# Patient Record
Sex: Male | Born: 1995 | Race: White | Hispanic: No | Marital: Single
Health system: Southern US, Community
[De-identification: ages and names within clinical notes are randomized; demographics above are authoritative.]

---
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
## 2018-11-13 ENCOUNTER — Other Ambulatory Visit: Payer: Self-pay

## 2018-11-13 DIAGNOSIS — Z20822 Contact with and (suspected) exposure to covid-19: Secondary | ICD-10-CM

## 2018-11-15 LAB — NOVEL CORONAVIRUS, NAA: SARS-CoV-2, NAA: NOT DETECTED

## 2020-02-16 ENCOUNTER — Other Ambulatory Visit: Payer: Self-pay

## 2020-02-16 ENCOUNTER — Emergency Department (HOSPITAL_BASED_OUTPATIENT_CLINIC_OR_DEPARTMENT_OTHER)
Admission: EM | Admit: 2020-02-16 | Discharge: 2020-02-16 | Disposition: A | Discharge status: Home / Self Care | Payer: Self-pay | Attending: Emergency Medicine | Admitting: Emergency Medicine

## 2020-02-16 DIAGNOSIS — R202 Paresthesia of skin: Secondary | ICD-10-CM | POA: Insufficient documentation

## 2020-02-16 DIAGNOSIS — F419 Anxiety disorder, unspecified: Secondary | ICD-10-CM | POA: Insufficient documentation

## 2020-02-16 DIAGNOSIS — R410 Disorientation, unspecified: Secondary | ICD-10-CM | POA: Insufficient documentation

## 2020-02-16 MED ORDER — LORAZEPAM 1 MG PO TABS
1.0000 mg | ORAL_TABLET | Freq: Once | ORAL | Status: AC
  Administered 2020-02-16: 1 mg via ORAL
  Filled 2020-02-16: qty 1

## 2020-02-16 NOTE — ED Notes (Signed)
States he feel's confused and feel's like he's in a fog.  States he has had panic attacks before.

## 2020-02-16 NOTE — ED Triage Notes (Signed)
Pt was at work when he started to feel disoriented, dizzy, midsternal chest pain. Hx of anxiety. Started wellbutrin 1 month ago. Took CBD oil this morning at 0930, which he normally does. States he feels like he is hallucinating and "like we are trying to hurt him"

## 2020-02-16 NOTE — Discharge Instructions (Signed)
You came to the emergency department today to be evaluated for your are "bizarre," disembodied feeling.  Your physical exam was very reassuring.  You received 1 mg of Ativan while in the hospital.  I believe that your symptoms are related to your anxiety.  Please follow-up with the provider who manages your Wellbutrin.  Please refrain from using CBD oil daily you can speak to your provider about this.    Get help right away if you have: A racing heart and shortness of breath. Thoughts of hurting yourself or others.

## 2020-02-16 NOTE — ED Provider Notes (Signed)
[facility] EMERGENCY DEPARTMENT Provider Note   CSN: [id number] Arrival date & time: 02/16/20  1528     History Chief Complaint  Patient presents with  . Chest Pain  . Psychiatric Evaluation    Antonio Parker is a 25 y.o. male with a history of anxiety (currently taking Wellbutrin 150 mg daily and 1 mg Ativan at bedtime.)  Presents with a chief complaint of anxiety and "bizarre, disoriented" feeling.  He reports that this started suddenly while at work approximately 15:00, patient reports that he had numbness and tingling in his hands, around his mouth.  Patient also expressed concern of chest pain with onset of symptoms.  That is symptoms have slowly improved since then.  She states that he has has been higher than usual lately.  Patient reports that he uses CBD oil daily to help with his anxiety.  He states that today he took more CBD oil than usual.  Patient reports that he has had anxiety attacks in the past however this " bizarre, disoriented," sensation is new.    At present patient denies any chest pain, shortness of breath, lightheadedness, dizziness.  Patient denies having any syncopal episodes.  Patient denies any suicidal ideations, homicidal ideations, auditory hallucinations, visual hallucinations.    HPI     No past medical history on file.  There are no problems to display for this patient.       No family history on file.     Home Medications Prior to Admission medications   Not on File    Allergies    Penicillins  Review of Systems   Review of Systems  Constitutional: Negative for chills and fever.  Eyes: Negative for visual disturbance.  Respiratory: Negative for shortness of breath.   Cardiovascular: Negative for chest pain.  Gastrointestinal: Negative for abdominal pain, nausea and vomiting.  Genitourinary: Negative for dysuria.  Musculoskeletal: Negative for back pain and neck pain.  Skin: Negative for color change and rash.   Neurological: Negative for dizziness, syncope, light-headedness and headaches.  Psychiatric/Behavioral: Negative for confusion, hallucinations, self-injury and suicidal ideas. The patient is nervous/anxious.     Physical Exam Updated Vital Signs BP 112/78   Pulse 99   Temp (!) 97.5 F (36.4 C) (Oral)   Resp (!) 21   Ht 5\' 10"  (1.778 m)   Wt 79.4 kg   SpO2 100%   BMI 25.11 kg/m   Physical Exam Vitals and nursing note reviewed.  Constitutional:      General: He is not in acute distress.    Appearance: He is not ill-appearing, toxic-appearing or diaphoretic.  HENT:     Head: Normocephalic and atraumatic. No raccoon eyes, abrasion, contusion, masses or laceration.     Jaw: No trismus or pain on movement.     Mouth/Throat:     Mouth: Mucous membranes are moist.     Pharynx: Uvula midline.  Eyes:     General: No scleral icterus.       Right eye: No discharge.        Left eye: No discharge.     Extraocular Movements: Extraocular movements intact.     Pupils: Pupils are equal, round, and reactive to light.  Cardiovascular:     Rate and Rhythm: Normal rate.     Heart sounds: Normal heart sounds.  Pulmonary:     Effort: Pulmonary effort is normal. No tachypnea, bradypnea, accessory muscle usage or respiratory distress.     Breath sounds: No stridor. No  wheezing, rhonchi or rales.  Abdominal:     General: Abdomen is flat. Bowel sounds are normal. There is no distension. There are no signs of injury.     Palpations: Abdomen is soft. There is no mass or pulsatile mass.     Tenderness: There is no abdominal tenderness. There is no guarding or rebound.  Musculoskeletal:     Cervical back: Normal range of motion and neck supple. No rigidity.  Skin:    General: Skin is warm and dry.     Coloration: Skin is not jaundiced or pale.     Findings: No erythema.  Neurological:     General: No focal deficit present.     Mental Status: He is alert and oriented to person, place, and time.      GCS: GCS eye subscore is 4. GCS verbal subscore is 5. GCS motor subscore is 6.     Cranial Nerves: No cranial nerve deficit or facial asymmetry.     Motor: No weakness, tremor, seizure activity or pronator drift.     Coordination: Romberg sign negative. Finger-Nose-Finger Test normal.     Gait: Gait is intact. Gait normal.     Comments: CN II-XII intact, equal grip strength, +5 strength to bilateral upper and lower extremities    Psychiatric:        Attention and Perception: He is attentive. He does not perceive auditory or visual hallucinations.        Mood and Affect: Mood is anxious.        Speech: Speech normal.        Behavior: Behavior is not agitated, slowed, aggressive, withdrawn, hyperactive or combative. Behavior is cooperative.        Thought Content: Thought content is not paranoid or delusional. Thought content does not include homicidal or suicidal ideation. Thought content does not include homicidal or suicidal plan.        Judgment: Judgment is not impulsive or inappropriate.     ED Results / Procedures / Treatments   Labs (all labs ordered are listed, but only abnormal results are displayed) Labs Reviewed - No data to display  EKG EKG Interpretation  Date/Time:  Tuesday February 16 2020 15:30:03 EST Ventricular Rate:  157 PR Interval:    QRS Duration: 90 QT Interval:  324 QTC Calculation: 523 R Axis:   85 Text Interpretation: Narrow QRS tachycardia Non-specific ST-t changes No previous tracing Confirmed by Cathren Laine ([id number]) on 02/16/2020 4:06:33 PM   Radiology No results found.  Procedures Procedures   Medications Ordered in ED Medications  LORazepam (ATIVAN) tablet 1 mg (1 mg Oral Given 02/16/20 1651)    ED Course  I have reviewed the triage vital signs and the nursing notes.  Pertinent labs & imaging results that were available during my care of the patient were reviewed by me and considered in my medical decision making (see chart for  details).    MDM Rules/Calculators/A&P                          Alert 25 year old male in no acute distress, nontoxic appearing.  Patient is found to be oriented to person, place, time and event.  Patient complains of anxiety and "bizarre, disoriented" feeling.  Patient has a history of anxiety and reportsmcurrently taking Wellbutrin 150 mg daily and 1 mg Ativan at bedtime.  Patient reports that he has been more anxious over the last few days.  Due to his increased  anxiety patient reports taking more CBD oil that he normally does.  Patient reports his "bizarre, disoriented," feeling started suddenly while at work he then began to feel anxious and experienced numbness and tingling in his extremities and around his mouth as well as chest pain.  Patient's chest pain has since resolved.  Patient reports that he still feels anxious and a disembodied feeling but states that it is improved.      Patient denies any suicidal or homicidal ideations, auditory or visual hallucinations, delusional or paranoid thoughts.  Patient originally presented to emergency department with tachycardia and hypertension.  Upon repeat examination vital signs have normalized.  Patient has no deficits on neurologic exam.  Patient does not appear to be responding to any internal stimuli.  Patient speech is not rapid or pressured.  Patient does not exhibit impulsive or inappropriate judgment.  Patient does not appear agitated, aggressive, or hyperactive.  Patient was given 1 mg of Ativan.  On serial reexaminations patient reports continuing improvement in his symptoms.  Patient remains alert and oriented x4.  Vital signs stable.  Patient symptoms likely due to anxiety versus reaction from increased CBD oil.  Will have patient follow-up with his primary care provider who manages anxiety.  Patient advised to refrain from using CBD oil.  Patient will return home with his mother who is at bedside.  Discussed strict return precautions.   Patient and his mother express understanding of all instructions and are in agreement with plan.  Final Clinical Impression(s) / ED Diagnoses Final diagnoses:  Anxiety    Rx / DC Orders ED Discharge Orders    None       Berneice Heinrich 02/16/20 2123    Cathren Laine, MD 02/16/20 2227
